# Patient Record
Sex: Male | Born: 1990 | Race: White | Hispanic: No | Marital: Single | Smoking: Never smoker
Health system: Southern US, Community
[De-identification: ages and names within clinical notes are randomized; demographics above are authoritative.]

## PROBLEM LIST (undated history)

## (undated) HISTORY — PX: TYMPANOSTOMY TUBE PLACEMENT: SHX32

## (undated) HISTORY — PX: HIP SURGERY: SHX245

---
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
## 2015-11-03 ENCOUNTER — Encounter: Payer: Self-pay | Admitting: Gynecology

## 2015-11-03 ENCOUNTER — Ambulatory Visit
Admission: EM | Admit: 2015-11-03 | Discharge: 2015-11-03 | Disposition: A | Discharge status: Home / Self Care | Payer: BLUE CROSS/BLUE SHIELD | Attending: Family Medicine | Admitting: Family Medicine

## 2015-11-03 ENCOUNTER — Ambulatory Visit (INDEPENDENT_AMBULATORY_CARE_PROVIDER_SITE_OTHER): Payer: BLUE CROSS/BLUE SHIELD

## 2015-11-03 DIAGNOSIS — S6392XA Sprain of unspecified part of left wrist and hand, initial encounter: Secondary | ICD-10-CM | POA: Diagnosis not present

## 2015-11-03 MED ORDER — ACETAMINOPHEN 500 MG PO TABS: 1000.0000 mg | ORAL_TABLET | Freq: Four times a day (QID) | ORAL | Status: AC | PRN

## 2015-11-03 MED ORDER — IBUPROFEN 800 MG PO TABS: 800.0000 mg | ORAL_TABLET | Freq: Three times a day (TID) | ORAL | Status: AC | PRN

## 2015-11-03 NOTE — ED Notes (Signed)
Patient c/o at work x 2 days and injury top of left hand. Per patient work as a [employer].

## 2015-11-03 NOTE — Discharge Instructions (Signed)
Wrist Sprain With Rehab A sprain is an injury in which a ligament that maintains the proper alignment of a joint is partially or completely torn. The ligaments of the wrist are susceptible to sprains. Sprains are classified into three categories. Grade 1 sprains cause pain, but the tendon is not lengthened. Grade 2 sprains include a lengthened ligament because the ligament is stretched or partially ruptured. With grade 2 sprains there is still function, although the function may be diminished. Grade 3 sprains are characterized by a complete tear of the tendon or muscle, and function is usually impaired. SYMPTOMS   Pain tenderness, inflammation, and/or bruising (contusion) of the injury.  A "pop" or tear felt and/or heard at the time of injury.  Decreased wrist function. CAUSES  A wrist sprain occurs when a force is placed on one or more ligaments that is greater than it/they can withstand. Common mechanisms of injury include:  Catching a ball with your hands.  Repetitive and/ or strenuous extension or flexion of the wrist. RISK INCREASES WITH:  Previous wrist injury.  Contact sports (boxing or wrestling).  Activities in which falling is common.  Poor strength and flexibility.  Improperly fitted or padded protective equipment. PREVENTION  Warm up and stretch properly before activity.  Allow for adequate recovery between workouts.  Maintain physical fitness:  Strength, flexibility, and endurance.  Cardiovascular fitness.  Protect the wrist joint by limiting its motion with the use of taping, braces, or splints.  Protect the wrist after injury for 6 to 12 months. PROGNOSIS  The prognosis for wrist sprains depends on the degree of injury. Grade 1 sprains require 2 to 6 weeks of treatment. Grade 2 sprains require 6 to 8 weeks of treatment, and grade 3 sprains require up to 12 weeks.  RELATED COMPLICATIONS   Prolonged healing time, if improperly treated or  re-injured.  Recurrent symptoms that result in a chronic problem.  Injury to nearby structures (bone, cartilage, nerves, or tendons).  Arthritis of the wrist.  Inability to compete in athletics at a high level.  Wrist stiffness or weakness.  Progression to a complete rupture of the ligament. TREATMENT  Treatment initially involves resting from any activities that aggravate the symptoms, and the use of ice and medications to help reduce pain and inflammation. Your caregiver may recommend immobilizing the wrist for a period of time in order to reduce stress on the ligament and allow for healing. After immobilization it is important to perform strengthening and stretching exercises to help regain strength and a full range of motion. These exercises may be completed at home or with a therapist. Surgery is not usually required for wrist sprains, unless the ligament has been ruptured (grade 3 sprain). MEDICATION   If pain medication is necessary, then nonsteroidal anti-inflammatory medications, such as aspirin and ibuprofen, or other minor pain relievers, such as acetaminophen, are often recommended.  Do not take pain medication for 7 days before surgery.  Prescription pain relievers may be given if deemed necessary by your caregiver. Use only as directed and only as much as you need. HEAT AND COLD  Cold treatment (icing) relieves pain and reduces inflammation. Cold treatment should be applied for 10 to 15 minutes every 2 to 3 hours for inflammation and pain and immediately after any activity that aggravates your symptoms. Use ice packs or massage the area with a piece of ice (ice massage).  Heat treatment may be used prior to performing the stretching and strengthening activities prescribed by your  caregiver, physical therapist, or athletic trainer. Use a heat pack or soak your injury in warm water. SEEK MEDICAL CARE IF:  Treatment seems to offer no benefit, or the condition worsens.  Any  medications produce adverse side effects. EXERCISES RANGE OF MOTION (ROM) AND STRETCHING EXERCISES - Wrist Sprain  These exercises may help you when beginning to rehabilitate your injury. Your symptoms may resolve with or without further involvement from your physician, physical therapist or athletic trainer. While completing these exercises, remember:   Restoring tissue flexibility helps normal motion to return to the joints. This allows healthier, less painful movement and activity.  An effective stretch should be held for at least 30 seconds.  A stretch should never be painful. You should only feel a gentle lengthening or release in the stretched tissue. RANGE OF MOTION - Wrist Flexion, Active-Assisted  Extend your right / left elbow with your fingers pointing down.*  Gently pull the back of your hand towards you until you feel a gentle stretch on the top of your forearm.  Hold this position for __________ seconds. Repeat __________ times. Complete this exercise __________ times per day.  *If directed by your physician, physical therapist or athletic trainer, complete this stretch with your elbow bent rather than extended. RANGE OF MOTION - Wrist Extension, Active-Assisted  Extend your right / left elbow and turn your palm upwards.*  Gently pull your palm/fingertips back so your wrist extends and your fingers point more toward the ground.  You should feel a gentle stretch on the inside of your forearm.  Hold this position for __________ seconds. Repeat __________ times. Complete this exercise __________ times per day. *If directed by your physician, physical therapist or athletic trainer, complete this stretch with your elbow bent, rather than extended. RANGE OF MOTION - Supination, Active  Stand or sit with your elbows at your side. Bend your right / left elbow to 90 degrees.  Turn your palm upward until you feel a gentle stretch on the inside of your forearm.  Hold this  position for __________ seconds. Slowly release and return to the starting position. Repeat __________ times. Complete this stretch __________ times per day.  RANGE OF MOTION - Pronation, Active  Stand or sit with your elbows at your side. Bend your right / left elbow to 90 degrees.  Turn your palm downward until you feel a gentle stretch on the top of your forearm.  Hold this position for __________ seconds. Slowly release and return to the starting position. Repeat __________ times. Complete this stretch __________ times per day.  STRETCH - Wrist Flexion  Place the back of your right / left hand on a tabletop leaving your elbow slightly bent. Your fingers should point away from your body.  Gently press the back of your hand down onto the table by straightening your elbow. You should feel a stretch on the top of your forearm.  Hold this position for __________ seconds. Repeat __________ times. Complete this stretch __________ times per day.  STRETCH - Wrist Extension  Place your right / left fingertips on a tabletop leaving your elbow slightly bent. Your fingers should point backwards.  Gently press your fingers and palm down onto the table by straightening your elbow. You should feel a stretch on the inside of your forearm.  Hold this position for __________ seconds. Repeat __________ times. Complete this stretch __________ times per day.  STRENGTHENING EXERCISES - Wrist Sprain These exercises may help you when beginning to rehabilitate your injury.  They may resolve your symptoms with or without further involvement from your physician, physical therapist or athletic trainer. While completing these exercises, remember:   Muscles can gain both the endurance and the strength needed for everyday activities through controlled exercises.  Complete these exercises as instructed by your physician, physical therapist or athletic trainer. Progress with the resistance and repetition exercises  only as your caregiver advises. STRENGTH - Wrist Flexors  Sit with your right / left forearm palm-up and fully supported. Your elbow should be resting below the height of your shoulder. Allow your wrist to extend over the edge of the surface.  Loosely holding a __________ weight or a piece of rubber exercise band/tubing, slowly curl your hand up toward your forearm.  Hold this position for __________ seconds. Slowly lower the wrist back to the starting position in a controlled manner. Repeat __________ times. Complete this exercise __________ times per day.  STRENGTH - Wrist Extensors  Sit with your right / left forearm palm-down and fully supported. Your elbow should be resting below the height of your shoulder. Allow your wrist to extend over the edge of the surface.  Loosely holding a __________ weight or a piece of rubber exercise band/tubing, slowly curl your hand up toward your forearm.  Hold this position for __________ seconds. Slowly lower the wrist back to the starting position in a controlled manner. Repeat __________ times. Complete this exercise __________ times per day.  STRENGTH - Ulnar Deviators  Stand with a ____________________ weight in your right / left hand, or sit holding on to the rubber exercise band/tubing with your opposite arm supported.  Move your wrist so that your pinkie travels toward your forearm and your thumb moves away from your forearm.  Hold this position for __________ seconds and then slowly lower the wrist back to the starting position. Repeat __________ times. Complete this exercise __________ times per day STRENGTH - Radial Deviators  Stand with a ____________________ weight in your  right / left hand, or sit holding on to the rubber exercise band/tubing with your arm supported.  Raise your hand upward in front of you or pull up on the rubber tubing.  Hold this position for __________ seconds and then slowly lower the wrist back to the  starting position. Repeat __________ times. Complete this exercise __________ times per day. STRENGTH - Forearm Supinators  Sit with your right / left forearm supported on a table, keeping your elbow below shoulder height. Rest your hand over the edge, palm down.  Gently grip a hammer or a soup ladle.  Without moving your elbow, slowly turn your palm and hand upward to a "thumbs-up" position.  Hold this position for __________ seconds. Slowly return to the starting position. Repeat __________ times. Complete this exercise __________ times per day.  STRENGTH - Forearm Pronators  Sit with your right / left forearm supported on a table, keeping your elbow below shoulder height. Rest your hand over the edge, palm up.  Gently grip a hammer or a soup ladle.  Without moving your elbow, slowly turn your palm and hand upward to a "thumbs-up" position.  Hold this position for __________ seconds. Slowly return to the starting position. Repeat __________ times. Complete this exercise __________ times per day.  STRENGTH - Grip  Grasp a tennis ball, a dense sponge, or a large, rolled sock in your hand.  Squeeze as hard as you can without increasing any pain.  Hold this position for __________ seconds. Release your grip slowly.  Repeat __________ times. Complete this exercise __________ times per day.    This information is not intended to replace advice given to you by your health care provider. Make sure you discuss any questions you have with your health care provider.   Document Released: 12/07/2005 Document Revised: 08/28/2015 Document Reviewed: 03/21/2009 Elsevier Interactive Patient Education 2016 [employer] Intermetacarpal Sprain The intermetacarpal ligaments run between the knuckles at the base of the fingers. These ligaments are vulnerable to sprain and injury in which the ligament becomes overstretched or torn. Intermetacarpal sprains are classified into 3 categories. Grade 1  sprains cause pain, but the tendon is not lengthened. Grade 2 sprains include a lengthened ligament, due to the ligament being stretched or partially ruptured. With grade 2 sprains there is still function, although function may be decreased. Grade 3 sprains include a complete tear of the ligament, and the joint usually displays a loss of function.  SYMPTOMS   Severe pain at the time of injury.  Often, a feeling of popping or tearing inside the hand.  Tenderness and inflammation at the knuckles.  Bruising within a couple days of injury.  Impaired ability to use the hand. CAUSES  This condition occurs when the intermetacarpal ligaments are subjected to a greater stress than they can handle. This causes the ligaments to become stretched or torn. RISK INCREASES WITH:  Previous hand injury.  Fighting sports (boxing, wrestling, martial arts).  Sports in which you could fall on an outstretched hand (soccer, basketball, volleyball).  Other sports with repeated hand trauma (water polo, gymnastics).  Poor hand strength and flexibility.  Inadequate or poorly fitted protective equipment. PREVENTION   Warm up and stretch properly before activity.  Maintain appropriate conditioning:  Hand flexibility.  Muscle strength and endurance.  Applying tape, protective strapping, or a brace may help prevent injury.  Provide the hand with support during sports and practice activities for 6 to 12 months following injury. PROGNOSIS  With proper treatment, healing should occur without impairment. The length of healing varies from 2 to 12 weeks, depending on the severity of injury. RELATED COMPLICATIONS   Longer healing time, if activities are resumed too soon.  Recurring symptoms or repeated injury, resulting in a chronic problem.  Injury to other nearby structures (bone, cartilage, tendon).  Arthritis of the knuckle (intermetacarpal) joint, with repeated sprains.  Prolonged disability  (sometimes).  Hand and finger stiffness or weakness. TREATMENT Treatment first involves ice and medicine to reduce pain and inflammation. An elastic compression bandage may be worn to reduce discomfort and to protect the area. Depending on the severity of injury, you may be required to restrain the area with a cast, splint, or brace. After the ligament has been allowed to heal, strengthening and stretching exercises may be needed to regain strength and a full range of motion. Exercises may be completed at home or with a therapist. Surgery is rarely needed. MEDICATION   If pain medicine is needed, nonsteroidal anti-inflammatory medicines (aspirin and ibuprofen), or other minor pain relievers (acetaminophen), are often advised.  Do not take pain medicine for 7 days before surgery.  Stronger pain relievers may be prescribed if your caregiver thinks they are needed. Use only as directed and only as much as you need. HEAT AND COLD  Cold treatment (icing) should be applied for 10 to 15 minutes every 2 to 3 hours for inflammation and pain, and immediately after activity that aggravates your symptoms. Use ice packs or an ice massage.  Heat treatment  may be used before performing stretching and strengthening activities prescribed by your caregiver, physical therapist, or athletic trainer. Use a heat pack or a warm water soak. SEEK MEDICAL CARE IF:   Symptoms remain or get worse, despite treatment for longer than 2 to 4 weeks.  You experience pain, numbness, discoloration, or coldness in the hand or fingers.  You develop blue, gray, or dark fingernails.  Any of the following occur after surgery: increased pain, swelling, redness, drainage of fluids, bleeding in the affected area, or signs of infection, including fever.  New, unexplained symptoms develop. (Drugs used in treatment may produce side effects.)   This information is not intended to replace advice given to you by your health care  provider. Make sure you discuss any questions you have with your health care provider.   Document Released: 12/07/2005 Document Revised: 12/28/2014 Document Reviewed: 05/27/2015 Elsevier Interactive Patient Education [employer]

## 2015-11-03 NOTE — ED Provider Notes (Signed)
CSN: [id number]     Arrival date & time 11/03/15  7829 History   First MD Initiated Contact with Patient 11/03/15 [phone]     Chief Complaint  Patient presents with  . Hand Problem   (Consider location/radiation/quality/duration/timing/severity/associated sxs/prior Treatment) HPI Comments: Single caucasian male here with mother.  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  Right hand dominant hasn't tried any medications/ice.  Only rested it.  The history is provided by the patient and a parent.    History reviewed. No pertinent past medical history. Past Surgical History  Procedure Laterality Date  . Tympanostomy tube placement    . Hip surgery Bilateral    History reviewed. No pertinent family history. Social History  Substance Use Topics  . Smoking status: Never Smoker   . Smokeless tobacco: None  . Alcohol Use: Yes    Review of Systems  Constitutional: Negative for fever and chills.  HENT: Negative for congestion, ear pain and sore throat.   Eyes: Negative for pain and discharge.  Respiratory: Negative for cough and wheezing.   Cardiovascular: Negative for chest pain and leg swelling.  Gastrointestinal: Negative for nausea, vomiting, diarrhea, constipation and blood in stool.  Genitourinary: Negative for dysuria, hematuria and difficulty urinating.  Musculoskeletal: Positive for myalgias. Negative for back pain, joint swelling, arthralgias, gait problem, neck pain and neck stiffness.  Skin: Negative for color change, pallor, rash and wound.  Allergic/Immunologic: Negative for environmental allergies and food allergies.  Neurological: Negative for headaches.  Hematological: Negative for adenopathy. Does not bruise/bleed easily.  Psychiatric/Behavioral: Negative for confusion and agitation. The patient is not nervous/anxious.      Allergies  Review of patient's allergies indicates no known allergies.  Home Medications   Prior to Admission medications   Medication Sig Start Date End Date Taking? Authorizing Provider  acetaminophen (TYLENOL) 500 MG tablet Take 2 tablets (1,000 mg total) by mouth every 6 (six) hours as needed for mild pain or moderate pain. 11/03/15 11/17/15  Barbaraann Barthel, NP  ibuprofen (ADVIL,MOTRIN) 800 MG tablet Take 1 tablet (800 mg total) by mouth every 8 (eight) hours as needed for moderate pain. 11/03/15 11/17/15  Barbaraann Barthel, NP   Meds Ordered and Administered this Visit  Medications - No data to display  BP 141/84 mmHg  Pulse 73  Temp(Src) 98.3 F (36.8 C) (Oral)  Resp 18  Ht 6' (1.829 m)  Wt 269 lb (122.018 kg)  BMI 36.48 kg/m2  SpO2 100% No data found.   Physical Exam  Constitutional: He is oriented to person, place, and time. Vital signs are normal. He appears well-developed and well-nourished. He is active and cooperative.  Non-toxic appearance. He does not have a sickly appearance. He does not appear ill. No distress.  HENT:  Head: Normocephalic and atraumatic.  Right Ear: External ear normal.  Left Ear: External ear normal.  Nose: Nose normal.  Mouth/Throat: Oropharynx is clear and moist. No oropharyngeal exudate.  Eyes: Conjunctivae, EOM and lids are normal. Pupils are equal, round, and reactive to light. Right eye exhibits no discharge. Left eye exhibits no discharge. No scleral icterus.  Neck: Trachea normal and normal range of motion. Neck supple. No tracheal deviation present.  Cardiovascular: Normal rate, regular rhythm, normal heart sounds and intact distal pulses.   Pulmonary/Chest: Effort normal and breath sounds normal. No  respiratory distress. He has no wheezes. He has no rales.  Abdominal: Soft. He exhibits no distension.  Musculoskeletal: He exhibits edema and tenderness.       Right wrist: Normal.       Left wrist: Normal.       Right hand:  Normal.       Left hand: He exhibits decreased range of motion, tenderness and swelling. He exhibits no bony tenderness, normal two-point discrimination, normal capillary refill, no deformity and no laceration. Normal sensation noted. Normal strength noted.       Hands: Increasing pain with wrist extension; 1+ nonpitting edema MTPs soft tissue 3&4 distal; distal radius and ulna not TTP; full finger AROM and strength   Neurological: He is alert and oriented to person, place, and time. He displays no atrophy and no tremor. No cranial nerve deficit or sensory deficit. He exhibits normal muscle tone. He displays no seizure activity. Coordination and gait normal. GCS eye subscore is 4. GCS verbal subscore is 5. GCS motor subscore is 6.  Skin: Skin is warm, dry and intact. No abrasion, no bruising, no burn, no ecchymosis, no laceration, no lesion, no petechiae and no rash noted. He is not diaphoretic. No cyanosis or erythema. No pallor. Nails show no clubbing.  Psychiatric: He has a normal mood and affect. His speech is normal and behavior is normal. Judgment and thought content normal. Cognition and memory are normal.  Nursing note and vitals reviewed.   ED Course  Procedures (including critical care time)  Labs Review Labs Reviewed - No data to display  Imaging Review Dg Hand Complete Left  11/03/2015  CLINICAL DATA:  PT was tightening a bolt while working on a car with his left hand and he felt a "pop". Pt states pain is mid posterior left hand. No previous injury or trauma. No hx of surgery. EXAM: LEFT HAND - COMPLETE 3+ VIEW COMPARISON:  None. FINDINGS: There is no evidence of fracture or dislocation. There is no evidence of arthropathy or other focal bone abnormality. Soft tissues are unremarkable. IMPRESSION: Negative. Electronically Signed   By: Amie Portlandavid  Ormond M.D.   On: 11/03/2015 09:15   0930 discussed negative normal hand xray given copy of radiology report.  Discussed probable muscle and  tendon strain left hand recommended splint to help with support left hand.  Avoid heavy lifting with left hand, impact to left hand x 7 days.  Most strains resolve over two weeks.  NSAIDS po prn tylenol preferred as does not raise blood pressure but if tylenol not enough may add motrin.  Patient fitted and distributed left wrist splint by Ancil Boozeramille Parker from clinic stock.  Ice on left hand and elevated after xray.  Patient and mother verbalized understanding of information/instructions, agreed with plan of care and had no further questions at this time.  MDM   1. Hand sprain, left, initial encounter    Patient was instructed to rest, ice and elevate left hand.  Exitcare handout on hand sprain, wrist sprain with rehab exercises given to patient.   Medications as directed.   Call or return to clinic as needed if these symptoms worsen or fail to improve as anticipated and will consider formal PT, reimaging and orthopedics referral.  Patient verbalized agreement and understanding of treatment plan and had no further questions at this time.  P2:  ROM, injury prevention   Barbaraann Barthelina A Betancourt, NP 11/03/15 0940

## 2017-01-28 IMAGING — CR DG HAND COMPLETE 3+V*L*
3 series · 3 of 3 positions shown · non-contrast
Comparison: None.

CLINICAL DATA: PT was tightening a bolt while working on a car with
his left hand and he felt a "pop". Pt states pain is mid posterior
left hand. No previous injury or trauma. No hx of surgery.

EXAM:
LEFT HAND - COMPLETE 3+ VIEW

[hand ap]
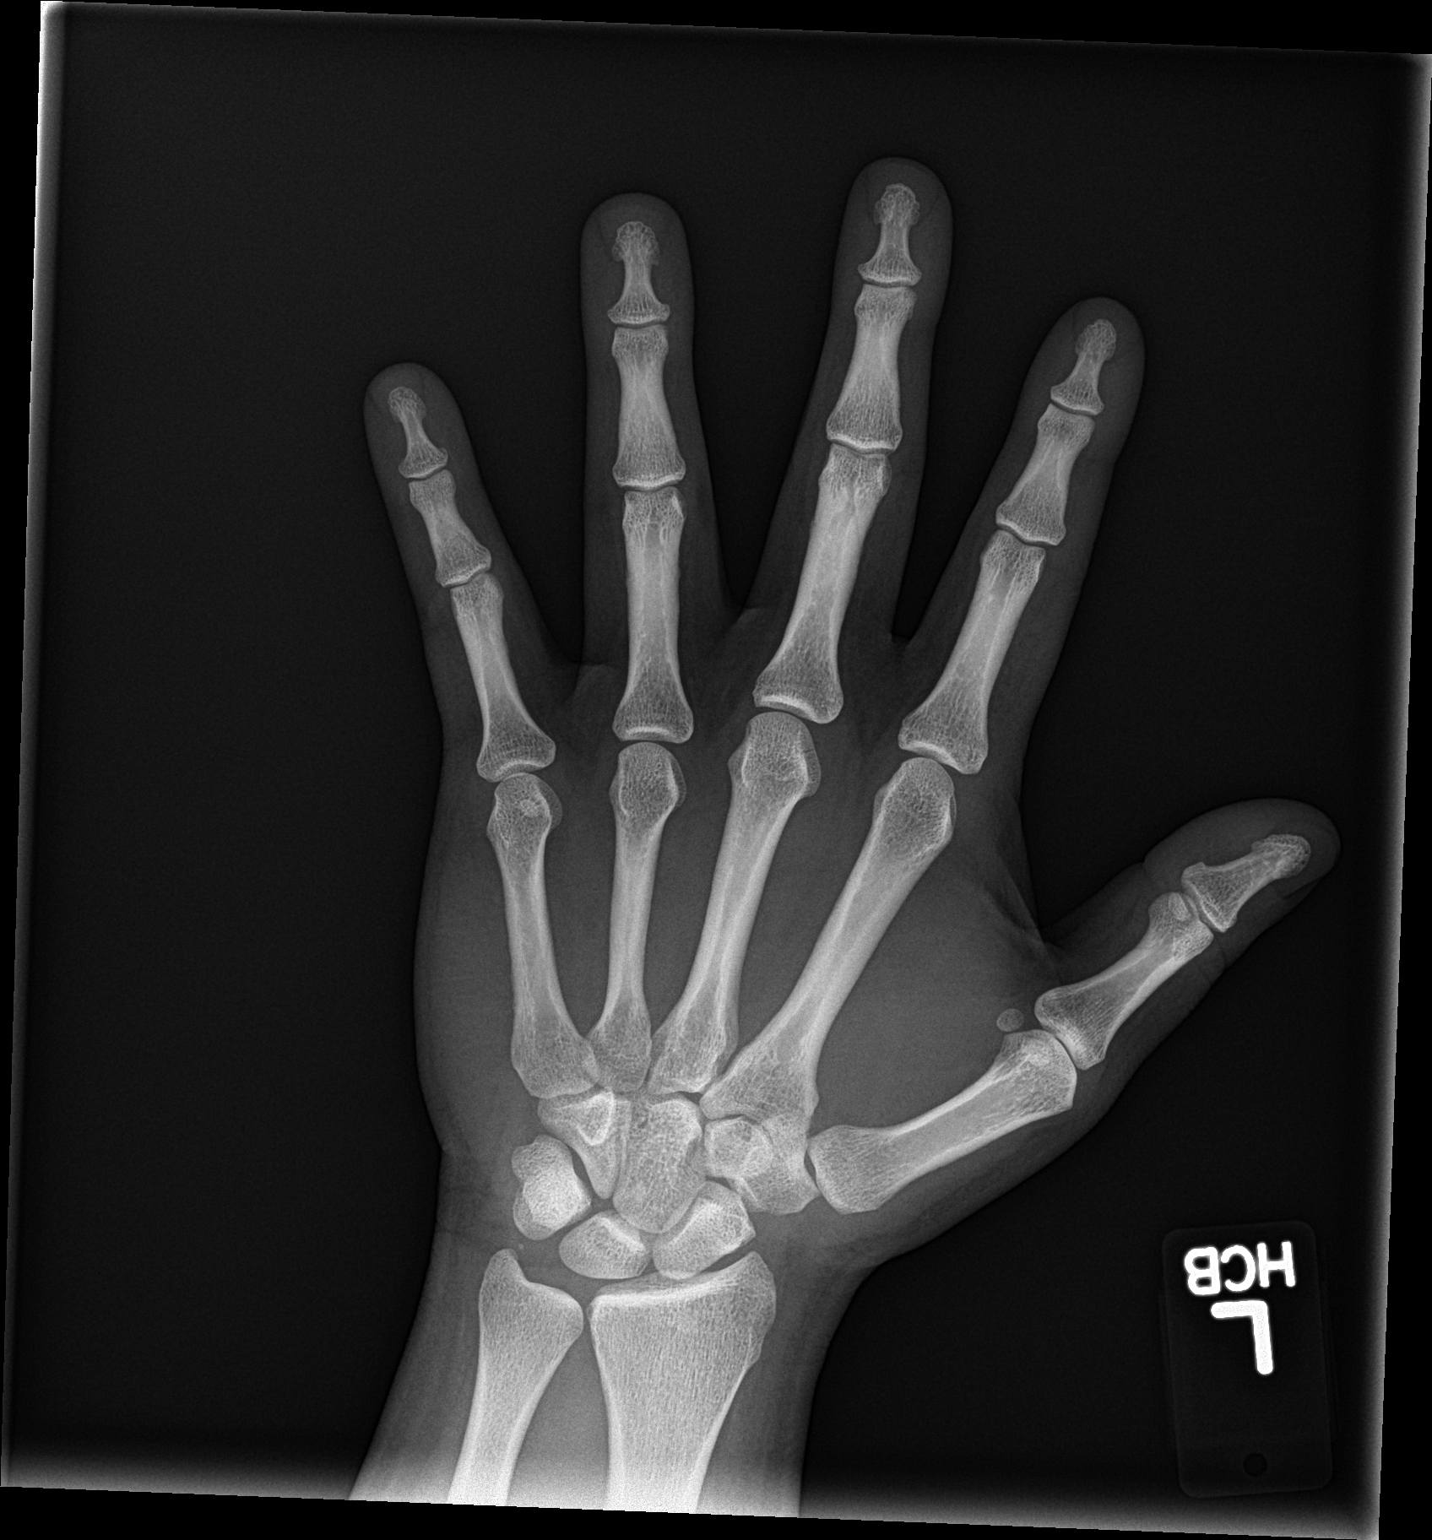

[hand obl]
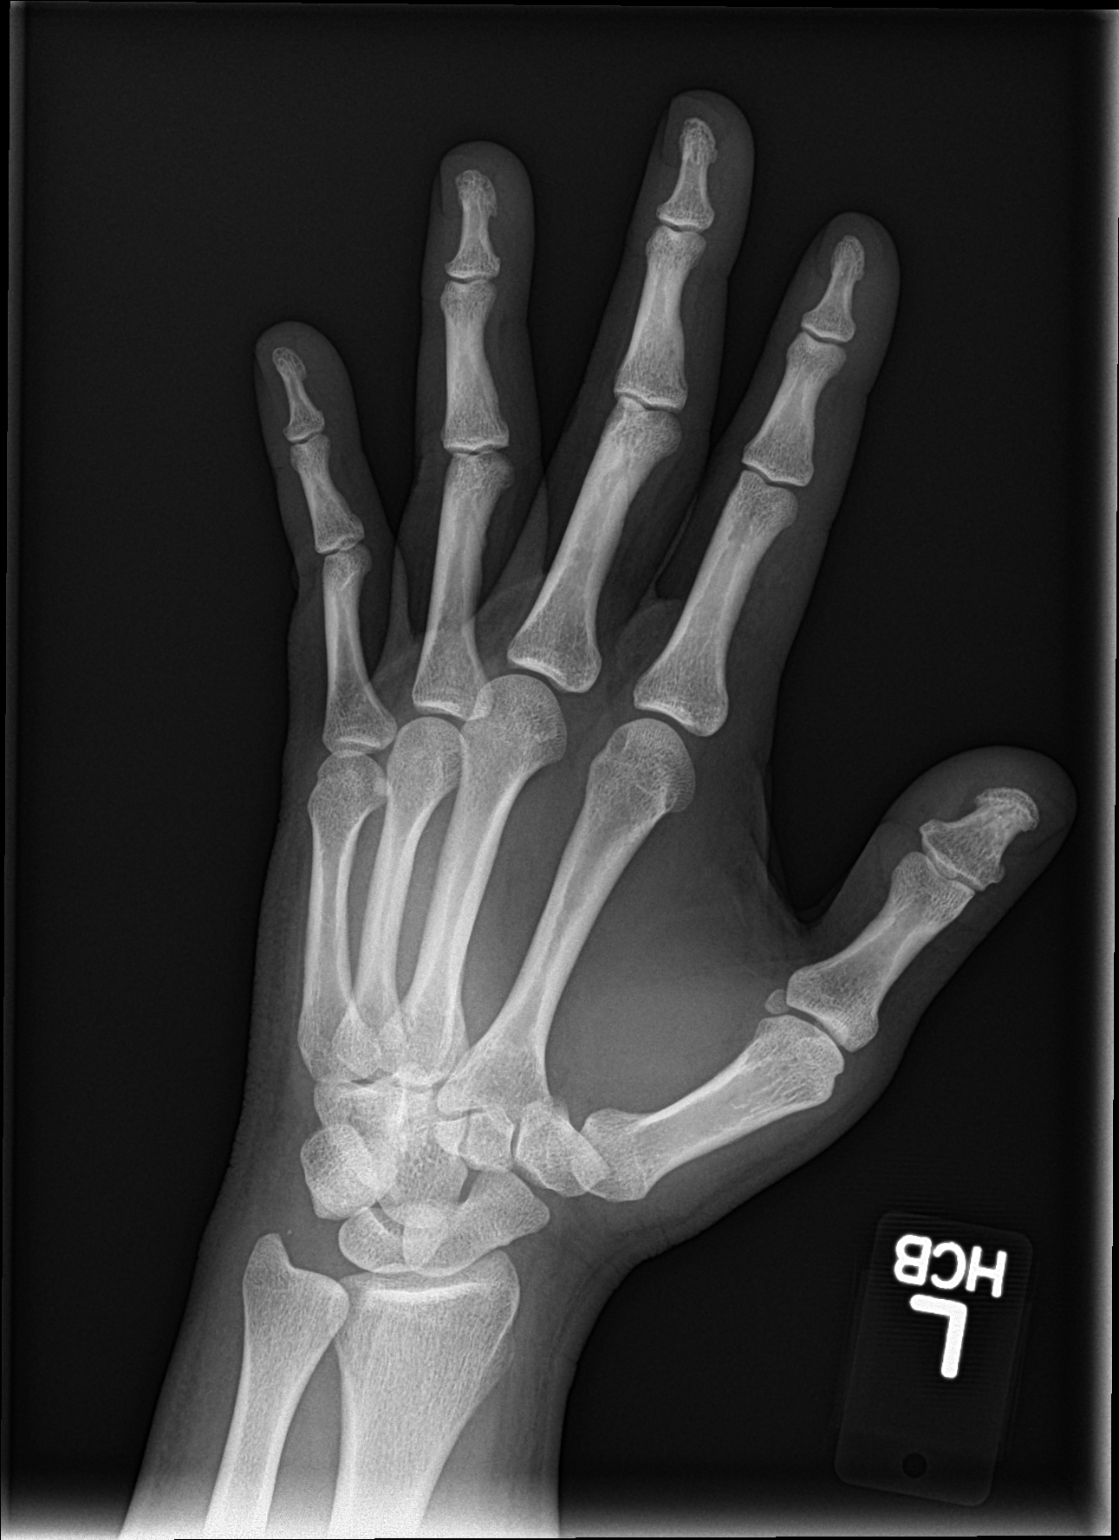

[hand lat]
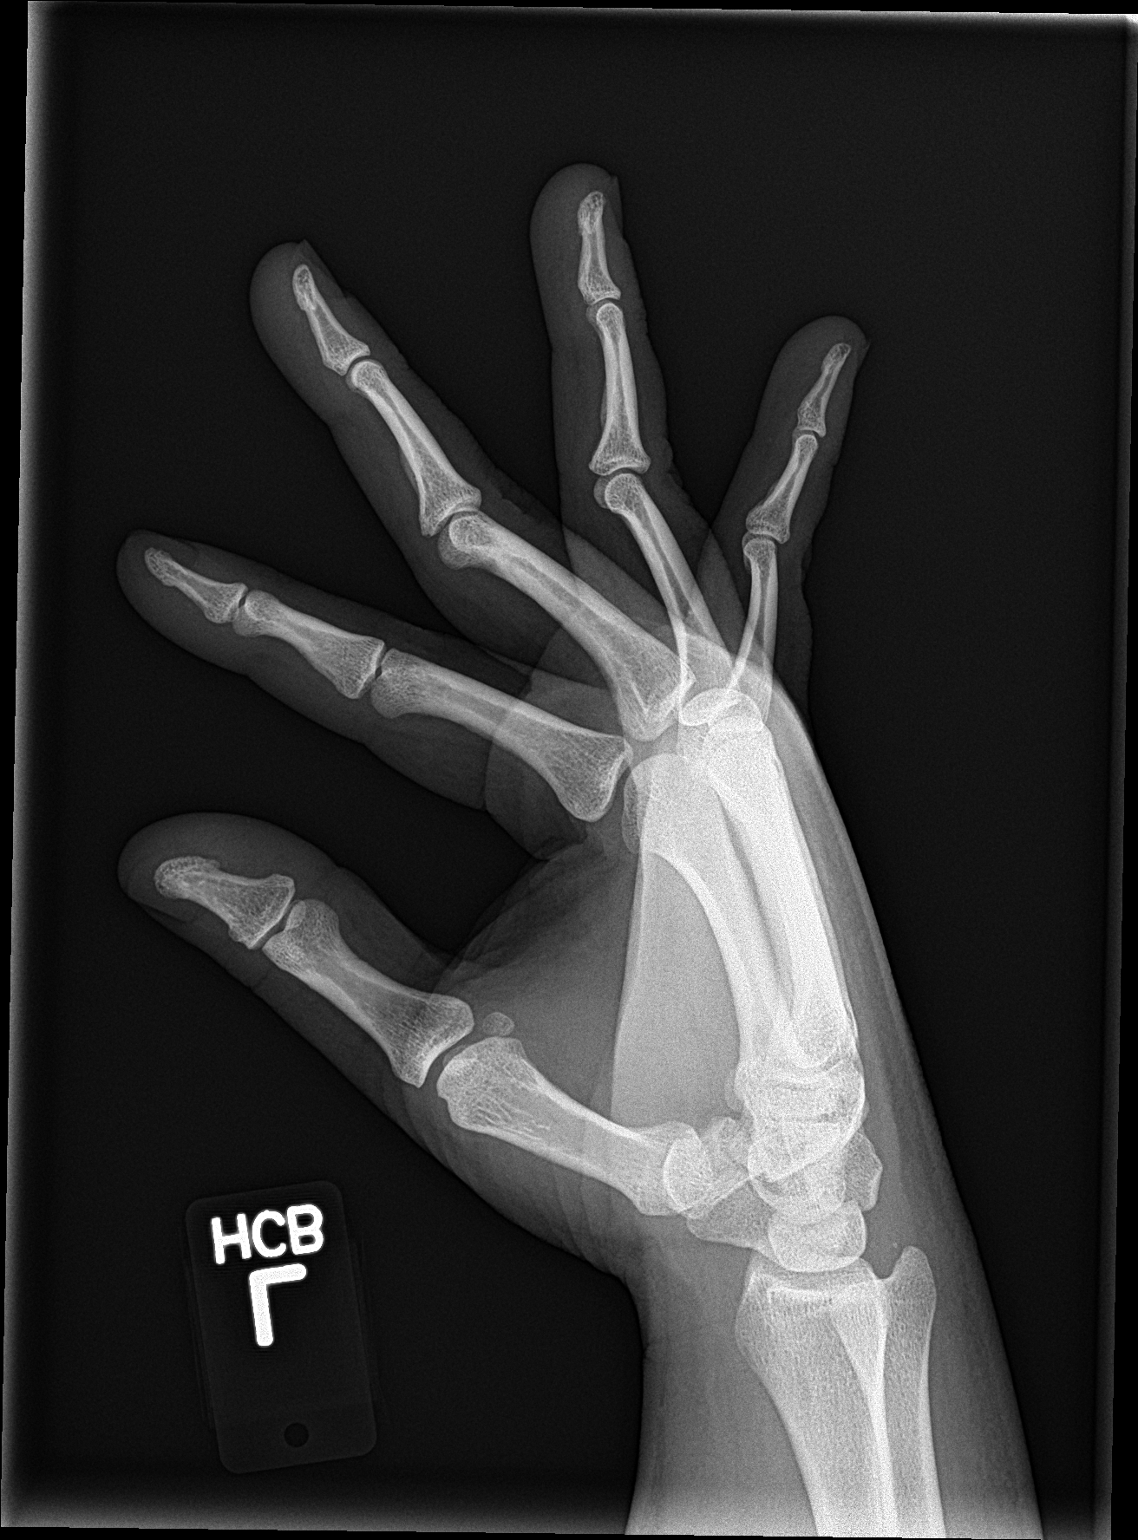

[3 of 3 positions shown; findings below may reference images not displayed]

FINDINGS: There is no evidence of fracture or dislocation. There is no
evidence of arthropathy or other focal bone abnormality. Soft
tissues are unremarkable.
IMPRESSION: Negative.
